# Patient Record
Sex: Female | Born: 1970 | Race: Black or African American | Hispanic: No | Marital: Single | Smoking: Current every day smoker
Health system: Southern US, Community
[De-identification: ages and names within clinical notes are randomized; demographics above are authoritative.]

## PROBLEM LIST (undated history)

## (undated) DIAGNOSIS — I10 Essential (primary) hypertension: Secondary | ICD-10-CM

## (undated) DIAGNOSIS — G4700 Insomnia, unspecified: Secondary | ICD-10-CM

## (undated) HISTORY — PX: LEEP: SHX91

## (undated) HISTORY — PX: TUBAL LIGATION: SHX77

## (undated) HISTORY — DX: Insomnia, unspecified: G47.00

## (undated) HISTORY — PX: ABDOMINAL HYSTERECTOMY: SUR658

## (undated) HISTORY — DX: Essential (primary) hypertension: I10

---

## 2006-01-15 ENCOUNTER — Ambulatory Visit: Payer: Self-pay | Admitting: Family Medicine

## 2007-06-16 DIAGNOSIS — I10 Essential (primary) hypertension: Secondary | ICD-10-CM

## 2007-06-16 DIAGNOSIS — F319 Bipolar disorder, unspecified: Secondary | ICD-10-CM

## 2007-06-16 DIAGNOSIS — N951 Menopausal and female climacteric states: Secondary | ICD-10-CM

## 2008-10-05 ENCOUNTER — Ambulatory Visit: Payer: Self-pay | Admitting: Unknown Physician Specialty

## 2008-10-18 ENCOUNTER — Ambulatory Visit: Payer: Self-pay | Admitting: Unknown Physician Specialty

## 2009-06-28 DIAGNOSIS — G479 Sleep disorder, unspecified: Secondary | ICD-10-CM

## 2009-10-31 DIAGNOSIS — I10 Essential (primary) hypertension: Secondary | ICD-10-CM | POA: Insufficient documentation

## 2010-12-23 ENCOUNTER — Ambulatory Visit: Payer: Self-pay

## 2011-03-11 ENCOUNTER — Emergency Department: Payer: Self-pay | Admitting: Unknown Physician Specialty

## 2011-03-11 ENCOUNTER — Encounter: Payer: Self-pay | Admitting: Cardiovascular Disease

## 2011-03-12 ENCOUNTER — Encounter: Payer: Self-pay | Admitting: Cardiovascular Disease

## 2011-03-12 ENCOUNTER — Observation Stay: Payer: Self-pay | Admitting: Internal Medicine

## 2011-03-12 DIAGNOSIS — R7989 Other specified abnormal findings of blood chemistry: Secondary | ICD-10-CM

## 2011-03-12 DIAGNOSIS — I471 Supraventricular tachycardia: Secondary | ICD-10-CM

## 2011-03-26 ENCOUNTER — Telehealth: Payer: Self-pay | Admitting: Cardiovascular Disease

## 2011-03-26 ENCOUNTER — Encounter: Payer: Self-pay | Admitting: Cardiovascular Disease

## 2011-03-26 NOTE — Telephone Encounter (Signed)
LMOM to remind of New Pt appt tomorrow with Gollan. °

## 2011-03-27 ENCOUNTER — Encounter: Payer: Self-pay | Admitting: Cardiovascular Disease

## 2011-03-27 ENCOUNTER — Ambulatory Visit (INDEPENDENT_AMBULATORY_CARE_PROVIDER_SITE_OTHER): Payer: Self-pay | Admitting: Cardiovascular Disease

## 2011-03-27 DIAGNOSIS — I10 Essential (primary) hypertension: Secondary | ICD-10-CM

## 2011-03-27 DIAGNOSIS — I498 Other specified cardiac arrhythmias: Secondary | ICD-10-CM

## 2011-03-27 DIAGNOSIS — I471 Supraventricular tachycardia: Secondary | ICD-10-CM

## 2011-03-27 DIAGNOSIS — F419 Anxiety disorder, unspecified: Secondary | ICD-10-CM

## 2011-03-27 MED ORDER — DILTIAZEM HCL 30 MG PO TABS: 30.0000 mg | ORAL_TABLET | Freq: Four times a day (QID) | ORAL | Status: DC | PRN

## 2011-03-27 NOTE — Assessment & Plan Note (Signed)
Blood pressure is well controlled on today's visit. We will keep her on diltiazem.

## 2011-03-27 NOTE — Patient Instructions (Signed)
You are doing well. Start diltiazem 30 mg every 6 hours as needed for tachycardia Please call us if you have new issues that need to be addressed before your next appt.  We will call you for a follow up Appt. In 6 months

## 2011-03-27 NOTE — Assessment & Plan Note (Signed)
She does have anxiety. We've asked her to discuss this with  Sharen Hint. Uncertain if she needs Xanax p.r.n.Marland Kitchen I believe her tachycardia/SVT is real. We need to document this with EKG during an episode.

## 2011-03-27 NOTE — Progress Notes (Signed)
   Patient ID: Brooke Arroyo, female    DOB: Apr 01, 1971, 40 y.o.   MRN: [id number]  HPI Comments: Brooke Arroyo is a very pleasant 40 year old woman with history of hypertension, paroxysmal SVT who was admitted to a [facility] with SVT for 6 hours, elevated cardiac enzymes. She also has a history of smoking. She was discharged after her beta blocker was changed to diltiazem CD.  She reports that she has been feeling better on the new medication. She wonders if her symptoms could be secondary to anxiety. She does report the heart rate when she has these rhythms are very rapid. She does have anxiety at work and her symptoms seemed to happen more around lunchtime. He diltiazem CD is expensive for her and she would like an alternate medication.  EKG today shows normal sinus rhythm with rate 75 beats per minute with no significant ST or T wave changes     Review of Systems  Constitutional: Negative.   HENT: Negative.   Eyes: Negative.   Respiratory: Negative.   Cardiovascular: Positive for palpitations.  Gastrointestinal: Negative.   Musculoskeletal: Negative.   Skin: Negative.   Neurological: Negative.   Hematological: Negative.   Psychiatric/Behavioral: The patient is nervous/anxious.   All other systems reviewed and are negative.    BP 122/76  Pulse 75  Ht 4\' 9"  (1.448 m)  Wt 136 lb 1.9 oz (61.744 kg)  BMI 29.46 kg/m2   Physical Exam  Nursing note and vitals reviewed. Constitutional: She is oriented to person, place, and time. She appears well-developed and well-nourished.  HENT:  Head: Normocephalic.  Nose: Nose normal.  Mouth/Throat: Oropharynx is clear and moist.  Eyes: Conjunctivae are normal. Pupils are equal, round, and reactive to light.  Neck: Normal range of motion. Neck supple. No JVD present.  Cardiovascular: Normal rate, regular rhythm, S1 normal, S2 normal, normal heart sounds and intact distal pulses.  Exam reveals no gallop and no friction rub.   No murmur  heard. Pulmonary/Chest: Effort normal and breath sounds normal. No respiratory distress. She has no wheezes. She has no rales. She exhibits no tenderness.  Abdominal: Soft. Bowel sounds are normal. She exhibits no distension. There is no tenderness.  Musculoskeletal: Normal range of motion. She exhibits no edema and no tenderness.  Lymphadenopathy:    She has no cervical adenopathy.  Neurological: She is alert and oriented to person, place, and time. Coordination normal.  Skin: Skin is warm and dry. No rash noted. No erythema.  Psychiatric: She has a normal mood and affect. Her behavior is normal. Judgment and thought content normal.         Assessment and Plan

## 2011-03-27 NOTE — Assessment & Plan Note (Signed)
She has what we believe to be SVT. She had been doing better on diltiazem CD 120 mg daily as she is unable to afford his medication on a regular basis. We will change it to diltiazem 30 mg q.i.d. She did take 60 mg in the morning as most of her symptoms seem to happen at lunchtime. For breakthrough episodes, she could take additional pill of diltiazem or atenolol

## 2013-07-28 ENCOUNTER — Ambulatory Visit: Payer: Self-pay | Admitting: Family Medicine

## 2013-08-04 ENCOUNTER — Other Ambulatory Visit: Payer: Self-pay

## 2013-08-04 LAB — LIPID PANEL
Cholesterol: 146 mg/dL (ref 0–200)
HDL Cholesterol: 66 mg/dL — ABNORMAL HIGH (ref 40–60)
Ldl Cholesterol, Calc: 70 mg/dL (ref 0–100)
Triglycerides: 50 mg/dL (ref 0–200)
VLDL Cholesterol, Calc: 10 mg/dL (ref 5–40)

## 2013-08-04 LAB — COMPREHENSIVE METABOLIC PANEL
Albumin: 3.5 g/dL (ref 3.4–5.0)
Alkaline Phosphatase: 84 U/L (ref 50–136)
BUN: 19 mg/dL — ABNORMAL HIGH (ref 7–18)
Calcium, Total: 9.5 mg/dL (ref 8.5–10.1)
Chloride: 103 mmol/L (ref 98–107)
Co2: 30 mmol/L (ref 21–32)
Creatinine: 0.74 mg/dL (ref 0.60–1.30)
EGFR (African American): >60
Glucose: 92 mg/dL (ref 65–99)
Osmolality: 279 (ref 275–301)
SGPT (ALT): 30 U/L (ref 12–78)
Sodium: 139 mmol/L (ref 136–145)

## 2013-08-04 LAB — HEMOGLOBIN A1C: Hemoglobin A1C: 6.2 % (ref 4.2–6.3)

## 2015-02-20 ENCOUNTER — Ambulatory Visit
Admit: 2015-02-20 | Disposition: A | Discharge status: Home / Self Care | Payer: Self-pay | Attending: Family Medicine | Admitting: Family Medicine

## 2015-09-02 ENCOUNTER — Other Ambulatory Visit: Payer: Self-pay

## 2015-09-02 ENCOUNTER — Ambulatory Visit: Payer: Self-pay | Admitting: Family Medicine

## 2015-09-02 DIAGNOSIS — E7800 Pure hypercholesterolemia, unspecified: Secondary | ICD-10-CM | POA: Insufficient documentation

## 2015-09-02 DIAGNOSIS — Z111 Encounter for screening for respiratory tuberculosis: Secondary | ICD-10-CM

## 2015-09-02 DIAGNOSIS — Z23 Encounter for immunization: Secondary | ICD-10-CM

## 2015-09-02 DIAGNOSIS — E559 Vitamin D deficiency, unspecified: Secondary | ICD-10-CM | POA: Insufficient documentation

## 2015-09-02 DIAGNOSIS — G4700 Insomnia, unspecified: Secondary | ICD-10-CM | POA: Insufficient documentation

## 2015-09-02 DIAGNOSIS — N951 Menopausal and female climacteric states: Secondary | ICD-10-CM | POA: Insufficient documentation

## 2015-09-02 DIAGNOSIS — L301 Dyshidrosis [pompholyx]: Secondary | ICD-10-CM | POA: Insufficient documentation

## 2015-09-02 DIAGNOSIS — R5383 Other fatigue: Secondary | ICD-10-CM | POA: Insufficient documentation

## 2015-09-02 DIAGNOSIS — K219 Gastro-esophageal reflux disease without esophagitis: Secondary | ICD-10-CM | POA: Insufficient documentation

## 2015-09-02 DIAGNOSIS — E0590 Thyrotoxicosis, unspecified without thyrotoxic crisis or storm: Secondary | ICD-10-CM | POA: Insufficient documentation

## 2015-09-02 DIAGNOSIS — I471 Supraventricular tachycardia: Secondary | ICD-10-CM | POA: Insufficient documentation

## 2015-09-02 NOTE — Progress Notes (Signed)
Patient ID: Brooke MerlinJoanna Saric, female   DOB: 06/06/1971, 44 y.o.   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 Given requisition for Gold Test. Denies recent respiratory illness.

## 2015-09-05 LAB — QUANTIFERON IN TUBE
QFT TB AG MINUS NIL VALUE: 0.1 IU/mL
QUANTIFERON MITOGEN VALUE: 9.97 IU/mL
QUANTIFERON NIL VALUE: 0.03 [IU]/mL
QUANTIFERON TB AG VALUE: 0.13 [IU]/mL

## 2015-09-05 LAB — QUANTIFERON TB GOLD ASSAY (BLOOD)

## 2015-09-06 ENCOUNTER — Telehealth: Payer: Self-pay

## 2015-09-06 NOTE — Telephone Encounter (Signed)
-----   Message from Tamsen Roersennis E Chrismon, [place] sent at 09/06/2015  9:45 AM EDT ----- TB Quantiferon test is negative.

## 2015-09-06 NOTE — Telephone Encounter (Signed)
Mailbox full and not accepting new messages at this time.  Thanks.  Brooke Arroyo

## 2015-09-09 NOTE — Telephone Encounter (Signed)
Patient advised as directed below. Per patient please fax results to attention [place] at [phone]  Faxed Results.  Thanks,  -Deseray Daponte

## 2015-09-09 NOTE — Telephone Encounter (Signed)
No answer/ mail box full.  Thanks,  -Kaedan Richert 

## 2015-10-21 ENCOUNTER — Other Ambulatory Visit: Payer: Self-pay | Admitting: Physician Assistant

## 2015-10-21 DIAGNOSIS — I10 Essential (primary) hypertension: Secondary | ICD-10-CM

## 2015-10-21 NOTE — Telephone Encounter (Signed)
Pt needs new refill on her hydrochlorothiazide 25 MG tablet   She uses [facility] [address]  Her call back is [phone]  Thanks Barth Kirkseri

## 2015-10-22 MED ORDER — HYDROCHLOROTHIAZIDE 25 MG PO TABS: 25.0000 mg | ORAL_TABLET | Freq: Every day | ORAL | Status: AC

## 2015-10-22 NOTE — Telephone Encounter (Signed)
Patient is requesting refill. Thanks!  

## 2015-11-15 ENCOUNTER — Ambulatory Visit (INDEPENDENT_AMBULATORY_CARE_PROVIDER_SITE_OTHER): Payer: Self-pay | Admitting: Physician Assistant

## 2015-11-15 ENCOUNTER — Encounter: Payer: Self-pay | Admitting: Physician Assistant

## 2015-11-15 VITALS — BP 124/82 | HR 116 | Temp 98.7°F | Resp 16 | Wt 149.0 lb

## 2015-11-15 DIAGNOSIS — L0292 Furuncle, unspecified: Secondary | ICD-10-CM

## 2015-11-15 MED ORDER — CEPHALEXIN 500 MG PO CAPS: 500.0000 mg | ORAL_CAPSULE | Freq: Two times a day (BID) | ORAL | Status: AC

## 2015-11-15 NOTE — Progress Notes (Signed)
       Patient: Brooke MerlinJoanna Axley Female    DOB: May 12, 1971   45 y.o.   MRN: [id number] Visit Date: 11/15/2015  Today's Provider: Margaretann LovelessJennifer M Leann Mayweather, PA-C   Chief Complaint  Patient presents with  . Rash   Subjective:    Rash This is a new (Per patient notice a white bump 9 month ago and it was not bothering her. But yesterday it was different.) problem. The current episode started yesterday Inetta Fermo([place] it pop and drainaged came out.). The problem has been gradually worsening since onset. Location: upper Inner Thigh. The rash is characterized by draining, pain, redness and swelling (Really painful per patient unable to walk). She was exposed to nothing. Associated symptoms include coughing. Pertinent negatives include no congestion, fever (Patient thinks she had one but not sure), shortness of breath or vomiting. Past treatments include nothing.   She does state she has similar areas that come up under her arms in the axilla region bilaterally as well.     Allergies  Allergen Reactions  . Diltiazem Hcl     Throat swelling.  Marland Kitchen. Penicillins   . Sulfamethoxazole-Trimethoprim     Other reaction(s): Dizziness Confusion (See patient message dated 04/18/12 for details of reaction)   Previous Medications   ATENOLOL (TENORMIN) 50 MG TABLET    Take by mouth.   HYDROCHLOROTHIAZIDE (HYDRODIURIL) 25 MG TABLET    Take 1 tablet (25 mg total) by mouth daily.    Review of Systems  Constitutional: Negative for fever (Patient thinks she had one but not sure) and chills.  HENT: Negative for congestion.   Respiratory: Positive for cough. Negative for chest tightness, shortness of breath and wheezing.   Cardiovascular: Negative for chest pain.  Gastrointestinal: Negative for nausea, vomiting and abdominal pain.  Skin: Positive for rash (Per patient it looks like a cyst and is red, tender hot and feels like there's a lot of fluid.).  Neurological: Negative for dizziness, weakness, light-headedness,  numbness and headaches.    Social History  Substance Use Topics  . Smoking status: Current Every Day Smoker -- 0.30 packs/day    Types: Cigarettes  . Smokeless tobacco: Never Used  . Alcohol Use: No   Objective:   BP 124/82 mmHg  Pulse 116  Temp(Src) 98.7 F (37.1 C) (Oral)  Resp 16  Wt 149 lb (67.586 kg)  Physical Exam  Constitutional: She appears well-developed and well-nourished. No distress.  Cardiovascular: Normal rate, regular rhythm and normal heart sounds.  Exam reveals no gallop and no friction rub.   No murmur heard. Pulmonary/Chest: Effort normal and breath sounds normal. No respiratory distress. She has no wheezes. She has no rales.  Skin: Skin is warm and dry. Lesion noted. She is not diaphoretic. There is erythema.     Vitals reviewed.       Assessment & Plan:     1. Boil Will not I&D since it is auto-draining at this time.  Will prescribe keflex as below for infection. Advised to use warm compresses, epsom salt soaks and tylenol prn for pain and/or fevers. She is to call the office if area worsens. - cephALEXin (KEFLEX) 500 MG capsule; Take 1 capsule (500 mg total) by mouth 2 (two) times daily.  Dispense: 20 capsule; Refill: 0       Margaretann LovelessJennifer M Cypress Fanfan, PA-C  [facility]

## 2015-11-15 NOTE — Patient Instructions (Signed)

## 2015-11-17 IMAGING — CR DG ABDOMEN 1V
1 series · 1 of 1 positions shown · non-contrast
Comparison: None.

CLINICAL DATA: Five days of constipation and lower abdominal
discomfort

EXAM:
ABDOMEN - 1 VIEW

[kdxr kidney ureter bladder]
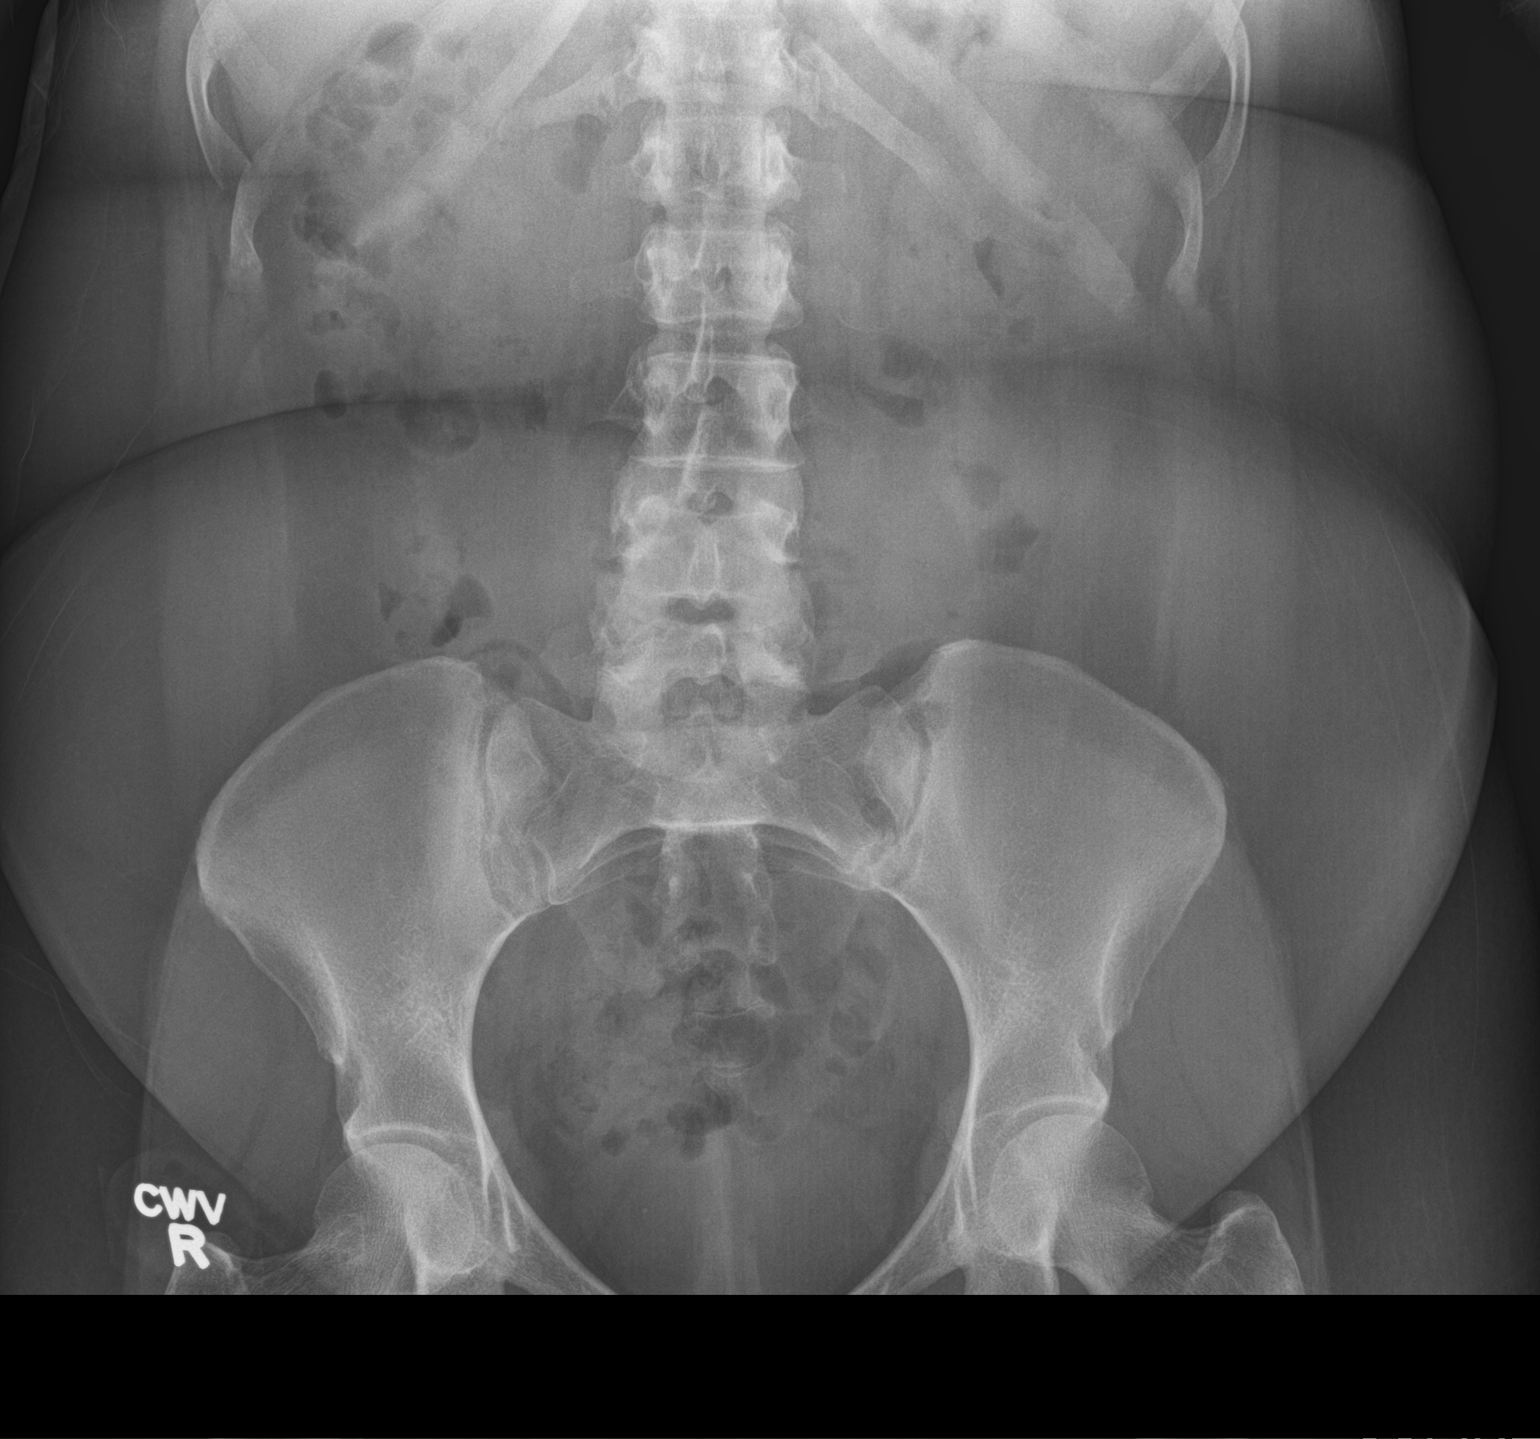

[1 of 1 positions shown; findings below may reference images not displayed]

FINDINGS: The bowel gas pattern is within the limits of normal. The stool
burden is not excessive. There are no abnormal soft tissue
calcifications. The bony structures are unremarkable.
IMPRESSION: No acute intra-abdominal abnormality is demonstrated. The stool
burden does not appear excessive.

## 2022-05-26 ENCOUNTER — Ambulatory Visit: Payer: Medicare Other

## 2022-05-26 ENCOUNTER — Ambulatory Visit (INDEPENDENT_AMBULATORY_CARE_PROVIDER_SITE_OTHER): Payer: Medicare Other | Admitting: Podiatry

## 2022-05-26 DIAGNOSIS — B353 Tinea pedis: Secondary | ICD-10-CM

## 2022-05-26 MED ORDER — CLOTRIMAZOLE-BETAMETHASONE 1-0.05 % EX CREA: 1.0000 | TOPICAL_CREAM | Freq: Two times a day (BID) | CUTANEOUS | 1 refills | Status: AC

## 2022-05-26 MED ORDER — TERBINAFINE HCL 250 MG PO TABS: 250.0000 mg | ORAL_TABLET | Freq: Every day | ORAL | 0 refills | Status: AC

## 2022-05-26 NOTE — Progress Notes (Signed)
   Chief Complaint  Patient presents with   Foot Pain    Patient is here complaining of left foot pain and possible foot fungus on the left foot that she states that she has had for 20 years.    HPI: 51 y.o. female presenting today for evaluation of 2 separate complaints.  First the patient states that for the past few years she has developed itching with burning and peeling skin to the left plantar foot and she is concerned for foot fungus.  She currently has not done anything for treatment.  Patient also states that intermittently she gets mild to moderate pain to the lateral aspect of the left foot.  She does have a history of first MTP arthrodesis to the right foot which she says changed her gait and she sometimes puts pressure on the lateral aspect of the left foot when she walks.  Denies a history of injury.  She presents for further treatment and evaluation  Past Medical History:  Diagnosis Date   Hypertension    Insomnia     Past Surgical History:  Procedure Laterality Date   ABDOMINAL HYSTERECTOMY     LEEP     for abnormal pap   TUBAL LIGATION      Allergies  Allergen Reactions   Diltiazem Hcl     Throat swelling.   Penicillins    Sulfamethoxazole-Trimethoprim     Other reaction(s): Dizziness Confusion (See patient message dated 04/18/12 for details of reaction)     Physical Exam: General: The patient is alert and oriented x3 in no acute distress.  Dermatology: Skin is warm, dry and supple bilateral lower extremities. Negative for open lesions or macerations.  Diffuse hyperkeratosis of skin with peeling and pruritus noted plantar weightbearing surface of the left foot consistent with tinea pedis  Vascular: Palpable pedal pulses bilaterally. Capillary refill within normal limits.  Negative for any significant edema or erythema  Neurological: Light touch and protective threshold grossly intact  Musculoskeletal Exam: No pedal deformities noted.  Currently there is no  pain or tenderness to the lateral aspect of the left foot.  Radiographic Exam:  Normal osseous mineralization. Joint spaces preserved. No fracture/dislocation/boney destruction.    Assessment: 1.  Tinea pedis left foot 2.  Mild intermittent capsulitis lateral aspect of the left foot; currently asymptomatic   Plan of Care:  1. Patient evaluated. X-Rays reviewed.  2.  Prescription for Lamisil 2 and 50 mg #30.  Patient denies a history of liver pathology or symptoms 3.  Prescription for Lotrisone cream apply 2 times daily 4.  Patient is on chronic use of Tylenol #3 for unrelated issues.  Continue as needed for intermittent left foot pain 5.  Continue wearing good supportive shoes and sneakers 6.  Return to clinic as needed      Felecia Shelling, DPM [facility]  Dr. Felecia Shelling, DPM    [address] [place], [place] [address]                Office [phone]  Fax [phone]
